# Patient Record
Sex: Male | Born: 1940 | Race: Black or African American | Hispanic: No | Marital: Single | Smoking: Never smoker
Health system: Southern US, Community
[De-identification: ages and names within clinical notes are randomized; demographics above are authoritative.]

## PROBLEM LIST (undated history)

## (undated) DIAGNOSIS — H1045 Other chronic allergic conjunctivitis: Secondary | ICD-10-CM

## (undated) DIAGNOSIS — J189 Pneumonia, unspecified organism: Secondary | ICD-10-CM

## (undated) DIAGNOSIS — K219 Gastro-esophageal reflux disease without esophagitis: Secondary | ICD-10-CM

## (undated) DIAGNOSIS — I209 Angina pectoris, unspecified: Secondary | ICD-10-CM

## (undated) DIAGNOSIS — N289 Disorder of kidney and ureter, unspecified: Secondary | ICD-10-CM

## (undated) DIAGNOSIS — J309 Allergic rhinitis, unspecified: Secondary | ICD-10-CM

## (undated) DIAGNOSIS — E782 Mixed hyperlipidemia: Secondary | ICD-10-CM

## (undated) DIAGNOSIS — I10 Essential (primary) hypertension: Secondary | ICD-10-CM

## (undated) DIAGNOSIS — M722 Plantar fascial fibromatosis: Secondary | ICD-10-CM

## (undated) DIAGNOSIS — N183 Chronic kidney disease, stage 3 unspecified: Secondary | ICD-10-CM

## (undated) DIAGNOSIS — E875 Hyperkalemia: Secondary | ICD-10-CM

## (undated) DIAGNOSIS — B351 Tinea unguium: Secondary | ICD-10-CM

## (undated) DIAGNOSIS — J439 Emphysema, unspecified: Secondary | ICD-10-CM

## (undated) DIAGNOSIS — M1990 Unspecified osteoarthritis, unspecified site: Secondary | ICD-10-CM

## (undated) DIAGNOSIS — E119 Type 2 diabetes mellitus without complications: Secondary | ICD-10-CM

## (undated) DIAGNOSIS — N390 Urinary tract infection, site not specified: Secondary | ICD-10-CM

## (undated) DIAGNOSIS — H814 Vertigo of central origin: Secondary | ICD-10-CM

## (undated) DIAGNOSIS — M7660 Achilles tendinitis, unspecified leg: Secondary | ICD-10-CM

## (undated) DIAGNOSIS — I2510 Atherosclerotic heart disease of native coronary artery without angina pectoris: Secondary | ICD-10-CM

## (undated) DIAGNOSIS — R002 Palpitations: Secondary | ICD-10-CM

## (undated) HISTORY — PX: CARDIAC CATHETERIZATION: SHX172

## (undated) HISTORY — PX: BACK SURGERY: SHX140

---

## 2016-04-19 ENCOUNTER — Observation Stay (HOSPITAL_COMMUNITY)
Admission: EM | Admit: 2016-04-19 | Discharge: 2016-04-20 | Disposition: A | Discharge status: Home / Self Care | Attending: Internal Medicine | Admitting: Internal Medicine

## 2016-04-19 ENCOUNTER — Observation Stay (HOSPITAL_COMMUNITY)

## 2016-04-19 ENCOUNTER — Emergency Department (HOSPITAL_COMMUNITY)

## 2016-04-19 ENCOUNTER — Encounter (HOSPITAL_COMMUNITY): Payer: Self-pay

## 2016-04-19 DIAGNOSIS — N183 Chronic kidney disease, stage 3 unspecified: Secondary | ICD-10-CM

## 2016-04-19 DIAGNOSIS — R799 Abnormal finding of blood chemistry, unspecified: Secondary | ICD-10-CM | POA: Diagnosis present

## 2016-04-19 DIAGNOSIS — I2510 Atherosclerotic heart disease of native coronary artery without angina pectoris: Secondary | ICD-10-CM | POA: Diagnosis not present

## 2016-04-19 DIAGNOSIS — E875 Hyperkalemia: Principal | ICD-10-CM | POA: Diagnosis present

## 2016-04-19 DIAGNOSIS — E1129 Type 2 diabetes mellitus with other diabetic kidney complication: Secondary | ICD-10-CM | POA: Diagnosis present

## 2016-04-19 DIAGNOSIS — N184 Chronic kidney disease, stage 4 (severe): Secondary | ICD-10-CM

## 2016-04-19 DIAGNOSIS — I129 Hypertensive chronic kidney disease with stage 1 through stage 4 chronic kidney disease, or unspecified chronic kidney disease: Secondary | ICD-10-CM | POA: Diagnosis not present

## 2016-04-19 DIAGNOSIS — D649 Anemia, unspecified: Secondary | ICD-10-CM | POA: Diagnosis present

## 2016-04-19 DIAGNOSIS — E1122 Type 2 diabetes mellitus with diabetic chronic kidney disease: Secondary | ICD-10-CM | POA: Insufficient documentation

## 2016-04-19 DIAGNOSIS — N19 Unspecified kidney failure: Secondary | ICD-10-CM

## 2016-04-19 DIAGNOSIS — N179 Acute kidney failure, unspecified: Secondary | ICD-10-CM | POA: Diagnosis present

## 2016-04-19 DIAGNOSIS — N189 Chronic kidney disease, unspecified: Secondary | ICD-10-CM | POA: Diagnosis present

## 2016-04-19 DIAGNOSIS — E1121 Type 2 diabetes mellitus with diabetic nephropathy: Secondary | ICD-10-CM

## 2016-04-19 DIAGNOSIS — N178 Other acute kidney failure: Secondary | ICD-10-CM

## 2016-04-19 HISTORY — DX: Emphysema, unspecified: J43.9

## 2016-04-19 HISTORY — DX: Disorder of kidney and ureter, unspecified: N28.9

## 2016-04-19 HISTORY — DX: Tinea unguium: B35.1

## 2016-04-19 HISTORY — DX: Pneumonia, unspecified organism: J18.9

## 2016-04-19 HISTORY — DX: Gastro-esophageal reflux disease without esophagitis: K21.9

## 2016-04-19 HISTORY — DX: Chronic kidney disease, stage 3 unspecified: N18.30

## 2016-04-19 HISTORY — DX: Other chronic allergic conjunctivitis: H10.45

## 2016-04-19 HISTORY — DX: Allergic rhinitis, unspecified: J30.9

## 2016-04-19 HISTORY — DX: Atherosclerotic heart disease of native coronary artery without angina pectoris: I25.10

## 2016-04-19 HISTORY — DX: Mixed hyperlipidemia: E78.2

## 2016-04-19 HISTORY — DX: Vertigo of central origin: H81.4

## 2016-04-19 HISTORY — DX: Palpitations: R00.2

## 2016-04-19 HISTORY — DX: Type 2 diabetes mellitus without complications: E11.9

## 2016-04-19 HISTORY — DX: Urinary tract infection, site not specified: N39.0

## 2016-04-19 HISTORY — DX: Angina pectoris, unspecified: I20.9

## 2016-04-19 HISTORY — DX: Chronic kidney disease, stage 3 (moderate): N18.3

## 2016-04-19 HISTORY — DX: Unspecified osteoarthritis, unspecified site: M19.90

## 2016-04-19 HISTORY — DX: Achilles tendinitis, unspecified leg: M76.60

## 2016-04-19 HISTORY — DX: Hyperkalemia: E87.5

## 2016-04-19 HISTORY — DX: Essential (primary) hypertension: I10

## 2016-04-19 HISTORY — DX: Plantar fascial fibromatosis: M72.2

## 2016-04-19 LAB — BASIC METABOLIC PANEL
ANION GAP: 9 (ref 5–15)
Anion gap: 6 (ref 5–15)
BUN: 45 mg/dL — ABNORMAL HIGH (ref 6–20)
BUN: 44 mg/dL — AB (ref 6–20)
CALCIUM: 9 mg/dL (ref 8.9–10.3)
CO2: 21 mmol/L — AB (ref 22–32)
CO2: 27 mmol/L (ref 22–32)
CREATININE: 2.82 mg/dL — AB (ref 0.61–1.24)
Calcium: 8.9 mg/dL (ref 8.9–10.3)
Chloride: 102 mmol/L (ref 101–111)
Chloride: 105 mmol/L (ref 101–111)
Creatinine, Ser: 2.93 mg/dL — ABNORMAL HIGH (ref 0.61–1.24)
GFR calc Af Amer: 23 mL/min — ABNORMAL LOW (ref >60)
GFR, EST AFRICAN AMERICAN: 22 mL/min — AB (ref >60)
GFR, EST NON AFRICAN AMERICAN: 19 mL/min — AB (ref >60)
GFR, EST NON AFRICAN AMERICAN: 20 mL/min — AB (ref >60)
GLUCOSE: 115 mg/dL — AB (ref 65–99)
Glucose, Bld: 110 mg/dL — ABNORMAL HIGH (ref 65–99)
Potassium: 5 mmol/L (ref 3.5–5.1)
Potassium: 4.6 mmol/L (ref 3.5–5.1)
SODIUM: 138 mmol/L (ref 135–145)
Sodium: 132 mmol/L — ABNORMAL LOW (ref 135–145)

## 2016-04-19 LAB — GLUCOSE, CAPILLARY
GLUCOSE-CAPILLARY: 71 mg/dL (ref 65–99)
GLUCOSE-CAPILLARY: 237 mg/dL — AB (ref 65–99)
Glucose-Capillary: 57 mg/dL — ABNORMAL LOW (ref 65–99)
Glucose-Capillary: 177 mg/dL — ABNORMAL HIGH (ref 65–99)
Glucose-Capillary: 122 mg/dL — ABNORMAL HIGH (ref 65–99)
Glucose-Capillary: 88 mg/dL (ref 65–99)

## 2016-04-19 LAB — CBC WITH DIFFERENTIAL/PLATELET
BASOS ABS: 0 10*3/uL (ref 0.0–0.1)
BASOS ABS: 0 10*3/uL (ref 0.0–0.1)
BASOS PCT: 1 %
Basophils Relative: 1 %
EOS ABS: 0.3 10*3/uL (ref 0.0–0.7)
EOS PCT: 4 %
Eosinophils Absolute: 0.3 10*3/uL (ref 0.0–0.7)
Eosinophils Relative: 4 %
HCT: 32.6 % — ABNORMAL LOW (ref 39.0–52.0)
HEMATOCRIT: 32.5 % — AB (ref 39.0–52.0)
HEMOGLOBIN: 11.1 g/dL — AB (ref 13.0–17.0)
Hemoglobin: 11 g/dL — ABNORMAL LOW (ref 13.0–17.0)
LYMPHS PCT: 48 %
Lymphocytes Relative: 52 %
Lymphs Abs: 3.3 10*3/uL (ref 0.7–4.0)
Lymphs Abs: 3.1 10*3/uL (ref 0.7–4.0)
MCH: 29.8 pg (ref 26.0–34.0)
MCH: 29.2 pg (ref 26.0–34.0)
MCHC: 34.2 g/dL (ref 30.0–36.0)
MCHC: 33.7 g/dL (ref 30.0–36.0)
MCV: 87.1 fL (ref 78.0–100.0)
MCV: 86.5 fL (ref 78.0–100.0)
MONO ABS: 0.6 10*3/uL (ref 0.1–1.0)
MONOS PCT: 9 %
Monocytes Absolute: 0.6 10*3/uL (ref 0.1–1.0)
Monocytes Relative: 10 %
NEUTROS ABS: 2.2 10*3/uL (ref 1.7–7.7)
Neutro Abs: 2.4 10*3/uL (ref 1.7–7.7)
Neutrophils Relative %: 34 %
Neutrophils Relative %: 37 %
PLATELETS: 298 10*3/uL (ref 150–400)
Platelets: 316 10*3/uL (ref 150–400)
RBC: 3.73 MIL/uL — ABNORMAL LOW (ref 4.22–5.81)
RBC: 3.77 MIL/uL — AB (ref 4.22–5.81)
RDW: 15.5 % (ref 11.5–15.5)
RDW: 14.9 % (ref 11.5–15.5)
WBC: 6.5 10*3/uL (ref 4.0–10.5)
WBC: 6.4 10*3/uL (ref 4.0–10.5)

## 2016-04-19 LAB — URINALYSIS, ROUTINE W REFLEX MICROSCOPIC
Bilirubin Urine: NEGATIVE
Glucose, UA: NEGATIVE mg/dL
Hgb urine dipstick: NEGATIVE
Ketones, ur: NEGATIVE mg/dL
LEUKOCYTES UA: NEGATIVE
NITRITE: NEGATIVE
PH: 6 (ref 5.0–8.0)
Protein, ur: NEGATIVE mg/dL
SPECIFIC GRAVITY, URINE: 1.004 — AB (ref 1.005–1.030)

## 2016-04-19 LAB — HEPATIC FUNCTION PANEL
ALT: 13 U/L — AB (ref 17–63)
AST: 21 U/L (ref 15–41)
Albumin: 3.8 g/dL (ref 3.5–5.0)
Alkaline Phosphatase: 68 U/L (ref 38–126)
TOTAL PROTEIN: 7.1 g/dL (ref 6.5–8.1)
Total Bilirubin: 0.5 mg/dL (ref 0.3–1.2)

## 2016-04-19 LAB — SODIUM, URINE, RANDOM: Sodium, Ur: 32 mmol/L

## 2016-04-19 LAB — CREATININE, URINE, RANDOM: CREATININE, URINE: 37.38 mg/dL

## 2016-04-19 LAB — MRSA PCR SCREENING: MRSA BY PCR: NEGATIVE

## 2016-04-19 LAB — PHOSPHORUS: PHOSPHORUS: 3.9 mg/dL (ref 2.5–4.6)

## 2016-04-19 MED ORDER — HEPARIN SODIUM (PORCINE) 5000 UNIT/ML IJ SOLN
5000.0000 [IU] | Freq: Three times a day (TID) | INTRAMUSCULAR | Status: DC
  Administered 2016-04-19 – 2016-04-20 (×4): 5000 [IU] via SUBCUTANEOUS
  Filled 2016-04-19 (×4): qty 1

## 2016-04-19 MED ORDER — ACETAMINOPHEN 650 MG RE SUPP: 650.0000 mg | Freq: Four times a day (QID) | RECTAL | Status: DC | PRN

## 2016-04-19 MED ORDER — SODIUM CHLORIDE 0.9 % IV BOLUS (SEPSIS)
500.0000 mL | Freq: Once | INTRAVENOUS | Status: AC
  Administered 2016-04-19: 500 mL via INTRAVENOUS

## 2016-04-19 MED ORDER — ONDANSETRON HCL 4 MG/2ML IJ SOLN: 4.0000 mg | Freq: Four times a day (QID) | INTRAMUSCULAR | Status: DC | PRN

## 2016-04-19 MED ORDER — ONDANSETRON HCL 4 MG PO TABS: 4.0000 mg | ORAL_TABLET | Freq: Four times a day (QID) | ORAL | Status: DC | PRN

## 2016-04-19 MED ORDER — ACETAMINOPHEN 325 MG PO TABS: 650.0000 mg | ORAL_TABLET | Freq: Four times a day (QID) | ORAL | Status: DC | PRN

## 2016-04-19 MED ORDER — INSULIN ASPART 100 UNIT/ML ~~LOC~~ SOLN
0.0000 [IU] | Freq: Three times a day (TID) | SUBCUTANEOUS | Status: DC
  Administered 2016-04-19: 2 [IU] via SUBCUTANEOUS
  Administered 2016-04-19: 1 [IU] via SUBCUTANEOUS
  Administered 2016-04-20: 3 [IU] via SUBCUTANEOUS
  Administered 2016-04-20: 2 [IU] via SUBCUTANEOUS

## 2016-04-19 MED ORDER — SODIUM CHLORIDE 0.9 % IV SOLN
INTRAVENOUS | Status: DC
Start: 2016-04-19 — End: 2016-04-19
  Administered 2016-04-19: 08:00:00 via INTRAVENOUS

## 2016-04-19 NOTE — Progress Notes (Signed)
New Admission Note:   Arrival Method: Stretcher from ED Mental Orientation: A&O x4 Telemetry: Box 9, CCMD notified. Assessment: Completed Skin: Assessed with Malen GauzeKim G, No skin issues noted IV: L hand, SL Pain: 0/10 Tubes: N/A Safety Measures: Safety Fall Prevention Plan discussed with patient. Admission: Completed [facility] Orientation: Patient has been orientated to the room, unit and the staff. Family: None at bedside  Orders have been reviewed and implemented. Will continue to monitor the patient. Call light has been placed within reach.  RN unable to complete admission questions due to time. WIll inform oncoming RN to complete.  Rivka BarbaraZenab Rachana Malesky BSN, RN  Phone Number: [phone]

## 2016-04-19 NOTE — Progress Notes (Signed)
Initial Nutrition Assessment  DOCUMENTATION CODES:   Obesity unspecified  INTERVENTION:  Continue rena/carbohydrate modified diet.   RD to continue to monitor for needs.  NUTRITION DIAGNOSIS:   Increased nutrient needs related to acute illness as evidenced by estimated needs.  GOAL:   Patient will meet greater than or equal to 90% of their needs  MONITOR:   PO intake, Labs, Weight trends, Skin, I & O's  REASON FOR ASSESSMENT:   Consult Assessment of nutrition requirement/status  ASSESSMENT:   76 y.o. male with history of diabetes mellitus type 2, hypertension, chronic kidney disease, anemia was [facility] after patient's lab work revealed hyperkalemia. Patient's lab drawn on 04/16/2016 was showing a creatinine of 2.6 with potassium of 5.3. Creatinine on 03/28/2016 was 2.4 potassium 4.9. In the ER today patient's creatinine is around 2.9 with normal potassium. Since patient has no definite follow-up and would like to make sure patient's electrolyte stability patient was admitted for further observation before discharge back to jail.  Meal completion has been 100%. Pt reports having a good appetite currently and PTA with usual consumption of at least 3 meals a day. Pt does report being on a special diet which he believes is a renal diet at his "camp" which he refers to jail. Pt reports no significant changes to his weight. Pt does reports consuming orange juice daily. Pt educated on renal friendly drink options. Pt expressed understanding. Pt with no observed significant fat or muscle mass loss.   Labs and medications reviewed.   Diet Order:  Diet renal/carb modified with fluid restriction Diet-HS Snack? Nothing; Room service appropriate? Yes; Fluid consistency: Thin  Skin:  Reviewed, no issues  Last BM:  1/7  Height:   Ht Readings from Last 1 Encounters:  04/19/16 6\' 1"  (1.854 m)    Weight:   Wt Readings from Last 1 Encounters:  04/19/16 252 lb 12.8 oz  (114.7 kg)    Ideal Body Weight:  83.6 kg  BMI:  Body mass index is 33.35 kg/m.  Estimated Nutritional Needs:   Kcal:  2100-2300  Protein:  100-115 grams  Fluid:  Per MD  EDUCATION NEEDS:   Education needs addressed  Roslyn SmilingStephanie Murice Barbar, MS, RD, LDN Pager # [phone] After hours/ weekend pager # [phone]

## 2016-04-19 NOTE — Progress Notes (Signed)
Hypoglycemic Event  CBG: 57  Treatment: 15 GM carbohydrate snack  Symptoms: None  Follow-up CBG: [id number] CBG Result:71  Possible Reasons for Event: inadequate meal intake  Comments/MD notified:Schorr, NP    Andrew Meyers

## 2016-04-19 NOTE — Progress Notes (Signed)
Progress note  Patient admitted earlier this morning. See H&P. Patient admitted after routine blood work revealed elevated Cr and hyperkalemia. He has no physical complaints and denies any CP SOB N/V/D dysuria. Unclear baseline Cr level.   Hyperkalemia resolved. K 4.6 today. Trend BMP Renal US: consistent with medical renal disease. There is no hydronephrosis. UA unremarkable for infectious process  FeNa 1.75% consistent with intrinsic cause of AKI  [place] [facility] [place] [facility] [place] [facility]  If blood work stable in 24 hours, likely discharge back to jail.   Noralee StainJennifer Axten Pascucci, DO [facility] [email or web address] Password [email or web address] 04/19/2016, 11:04 AM

## 2016-04-19 NOTE — ED Notes (Signed)
Main lab confirms CBC can be added on to blood collected earlier

## 2016-04-19 NOTE — H&P (Signed)
History and Physical    Andrew Meyers DOB: Oct 31, 1940 DOA: 04/19/2016  PCP: No primary care provider on file.  [facility]  Chief Complaint: Worsening renal function. And hyperkalemia.  HPI: Andrew PouchJames XXXFreeman is a 76 y.o. male with history of diabetes mellitus type 2, hypertension, chronic kidney disease, anemia was referred from the jail after patient's lab work revealed hyperkalemia. Patient's lab drawn on 04/16/2016 was showing a creatinine of 2.6 with potassium of 5.3. Creatinine on 03/28/2016 was 2.4 potassium 4.9. In the ER today patient's creatinine is around 2.9 with normal potassium. Since patient has no definite follow-up and would like to make sure patient's electrolyte stability patient was admitted for further observation before discharge back to jail. Patient denies any chest pain nausea vomiting abdominal pain diarrhea. Occasionally get short of breath. Patient also has been having increasing weight over the last 2 years.   ED Course: Patient was given 500 mL of normal saline bolus. UA still pending.  Review of Systems: As per HPI, rest all negative.   Past Medical History:  Diagnosis Date  . Achilles tendonitis   . Allergic rhinitis   . Angina pectoris (HCC)   . Chronic allergic conjunctivitis   . CKD (chronic kidney disease), stage III   . Coronary artery disease   . Diabetes mellitus without complication (HCC)   . Emphysema lung (HCC)   . GERD (gastroesophageal reflux disease)   . Hyperkalemia   . Hypertension   . Mixed hyperlipidemia   . Osteoarthritis   . Palpitations   . Plantar fascial fibromatosis   . Pneumonia   . Renal disorder   . Tinea unguium   . UTI (urinary tract infection)   . Vertigo of central origin     Past Surgical History:  Procedure Laterality Date  . BACK SURGERY    . CARDIAC CATHETERIZATION       reports that he has never smoked. He has never used smokeless tobacco. He reports that he does not drink  alcohol or use drugs.  No Known Allergies  Family History  Problem Relation Age of Onset  . Kidney disease Neg Hx     Prior to Admission medications   Not on File    Physical Exam: Vitals:   04/19/16 0445 04/19/16 0545 04/19/16 0629 04/19/16 0643  BP: 132/62 137/57  (!) 151/63  Pulse: (!) 48 (!) 53  (!) 58  Resp: 18 13  14   Temp:    97.4 F (36.3 C)  TempSrc:    Oral  SpO2: 97% 95%  98%  Height:   6\' 1"  (1.854 m)       Constitutional: Moderately built and nourished. Vitals:   04/19/16 0445 04/19/16 0545 04/19/16 0629 04/19/16 0643  BP: 132/62 137/57  (!) 151/63  Pulse: (!) 48 (!) 53  (!) 58  Resp: 18 13  14   Temp:    97.4 F (36.3 C)  TempSrc:    Oral  SpO2: 97% 95%  98%  Height:   6\' 1"  (1.854 m)    Eyes: Anicteric no pallor. ENMT: No discharge from the ears eyes nose and mouth. Neck: No mass felt. JVD not elevated. Respiratory: No rhonchi or crepitations. Cardiovascular: S1 and S2 heard. Abdomen: Soft nontender bowel sounds present. No guarding or rigidity. Musculoskeletal: No edema. No joint effusion. Skin: No rash. Skin appears warm. Neurologic: Alert awake oriented to time place and person. Moves all extremities. Psychiatric: Appears normal. Normal affect.   Labs on  Admission: I have personally reviewed following labs and imaging studies  CBC:  Recent Labs Lab 04/19/16 0023  WBC 6.5  NEUTROABS 2.2  HGB 11.1*  HCT 32.5*  MCV 87.1  PLT 316   Basic Metabolic Panel:  Recent Labs Lab 04/19/16 0023  NA 132*  K 5.0  CL 102  CO2 21*  GLUCOSE 110*  BUN 45*  CREATININE 2.93*  CALCIUM 9.0   GFR: CrCl cannot be calculated (Unknown ideal weight.). Liver Function Tests: No results for input(s): AST, ALT, ALKPHOS, BILITOT, PROT, ALBUMIN in the last 168 hours. No results for input(s): LIPASE, AMYLASE in the last 168 hours. No results for input(s): AMMONIA in the last 168 hours. Coagulation Profile: No results for input(s): INR, PROTIME in  the last 168 hours. Cardiac Enzymes: No results for input(s): CKTOTAL, CKMB, CKMBINDEX, TROPONINI in the last 168 hours. BNP (last 3 results) No results for input(s): PROBNP in the last 8760 hours. HbA1C: No results for input(s): HGBA1C in the last 72 hours. CBG:  Recent Labs Lab 04/19/16 0633  GLUCAP 57*   Lipid Profile: No results for input(s): CHOL, HDL, LDLCALC, TRIG, CHOLHDL, LDLDIRECT in the last 72 hours. Thyroid Function Tests: No results for input(s): TSH, T4TOTAL, FREET4, T3FREE, THYROIDAB in the last 72 hours. Anemia Panel: No results for input(s): VITAMINB12, FOLATE, FERRITIN, TIBC, IRON, RETICCTPCT in the last 72 hours. Urine analysis: No results found for: COLORURINE, APPEARANCEUR, LABSPEC, PHURINE, GLUCOSEU, HGBUR, BILIRUBINUR, KETONESUR, PROTEINUR, UROBILINOGEN, NITRITE, LEUKOCYTESUR Sepsis Labs: @LABRCNTIP (procalcitonin:4,lacticidven:4) )No results found for this or any previous visit (from the past 240 hour(s)).   Radiological Exams on Admission: Dg Chest Port 1 View  Result Date: 04/19/2016 CLINICAL DATA:  76 y/o M; respiratory failure and shortness of breath. EXAM: PORTABLE CHEST 1 VIEW COMPARISON:  None. FINDINGS: Cardiac silhouette within normal limits given projection and technique. Mild pulmonary vascular congestion. No focal consolidation. No pleural effusion or pneumothorax. Bones are unremarkable. Aortic atherosclerosis with arch calcification. IMPRESSION: Mild pulmonary vascular congestion. No focal consolidation. Aortic atherosclerosis. Electronically Signed   By: Mitzi Hansen M.D.   On: 04/19/2016 05:08     Assessment/Plan Principal Problem:   Renal failure (ARF), acute on chronic (HCC) Active Problems:   Hyperkalemia   DM (diabetes mellitus), type 2 with renal complications (HCC)   Normocytic normochromic anemia    1. 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 Patient did receive normal saline bolus 500 mL in the ER. Will hold off any further fluids. Check FENa and UA which are pending. I have ordered a renal ultrasound and placed patient on renal carb modified diet and consulted nutritionist for dietary recommendations. 2. Diabetes mellitus type 2 - for now I have placed patient on sliding scale coverage. Home medication has to be verified. 3. Hypertension presently off medication since patient's blood pressure is running in the lower side as per the patient. 4. Chronic anemia - check anemia panel. 5. History of CAD - patient states he has had cardiac cath before but no stents. Denies any chest pain at this time.  Patient's home medications has to be verified.  DVT prophylaxis: Heparin. Code Status: Full code.  Family Communication: Discussed with patient.  Disposition Plan: Probably back to jail.  Consults called: None.  Admission status: Observation.    Eduard Clos MD [facility] Pager [phone].  If 7PM-7AM, please contact night-coverage [email or web address] Password [facility]  04/19/2016, 6:56  AM

## 2016-04-19 NOTE — ED Provider Notes (Signed)
[facility]-EMERGENCY DEPT Provider Note   CSN: [id number] Arrival date & time: 04/19/16  0000  By signing my name below, I, Majel Homer, attest that this documentation has been prepared under the direction and in the presence of Shon Baton, MD . Electronically Signed: Majel Homer, Scribe. 04/19/2016. 3:16 AM.  History   Chief Complaint Chief Complaint  Patient presents with  . Abnormal Lab   The history is provided by the patient. No language interpreter was used.   HPI Comments: Andrew Meyers is a 76 y.o. male with PMHx of DM and HTN, brought in by [employer] to the Emergency Department for an evaluation of abnormal labs. Pt reports he was called by his Dr. Cathey Endow, at [facility] this afternoon due to elevated potassium. He states he was advised to visit the ED for possible admission. Pt denies hx of kidney disease, decreased appetite, use of dialysis, chest pain, shortness of breath, leg swelling, dysuria, and urinary frequency. She is currently asymptomatic. It is unclear why labs were drawn. I have lab work at the bedside showing progressive worsening renal function over the last week with incremental increase of potassium. Unknown baseline.  Past Medical History:  Diagnosis Date  . Achilles tendonitis   . Allergic rhinitis   . Angina pectoris (HCC)   . Chronic allergic conjunctivitis   . CKD (chronic kidney disease), stage III   . Coronary artery disease   . Diabetes mellitus without complication (HCC)   . Emphysema lung (HCC)   . GERD (gastroesophageal reflux disease)   . Hyperkalemia   . Hypertension   . Mixed hyperlipidemia   . Osteoarthritis   . Palpitations   . Plantar fascial fibromatosis   . Pneumonia   . Renal disorder   . Tinea unguium   . UTI (urinary tract infection)   . Vertigo of central origin    Patient Active Problem List   Diagnosis Date Noted  . Hyperkalemia 04/19/2016   History reviewed. No pertinent surgical history.  Home  Medications    Prior to Admission medications   Not on File    Family History History reviewed. No pertinent family history.  Social History Social History  Substance Use Topics  . Smoking status: Never Smoker  . Smokeless tobacco: Never Used  . Alcohol use No   Allergies   Patient has no known allergies.   Review of Systems Review of Systems  Constitutional: Negative for appetite change.  Respiratory: Negative for shortness of breath.   Cardiovascular: Negative for chest pain and leg swelling.  Genitourinary: Negative for dysuria and frequency.  All other systems reviewed and are negative.  Physical Exam Updated Vital Signs BP (!) 128/44   Pulse 63   Temp 98.2 F (36.8 C) (Oral)   Resp 16   SpO2 100%   Physical Exam  Constitutional: He is oriented to person, place, and time. No distress.  Elderly, no acute distress  HENT:  Head: Normocephalic and atraumatic.  Cardiovascular: Normal rate, regular rhythm and normal heart sounds.   No murmur heard. Pulmonary/Chest: Effort normal and breath sounds normal. No respiratory distress. He has no wheezes.  Abdominal: Soft. Bowel sounds are normal. There is no tenderness. There is no rebound.  Musculoskeletal: He exhibits edema.  1+ pitting bilateral lower extremity edema  Neurological: He is alert and oriented to person, place, and time.  Skin: Skin is warm and dry.  Psychiatric: He has a normal mood and affect.  Nursing note and vitals reviewed.  ED Treatments / Results  Labs (all labs ordered are listed, but only abnormal results are displayed) Labs Reviewed  BASIC METABOLIC PANEL - Abnormal; Notable for the following:       Result Value   Sodium 132 (*)    CO2 21 (*)    Glucose, Bld 110 (*)    BUN 45 (*)    Creatinine, Ser 2.93 (*)    GFR calc non Af Amer 19 (*)    GFR calc Af Amer 22 (*)    All other components within normal limits  CBC WITH DIFFERENTIAL/PLATELET - Abnormal; Notable for the following:     RBC 3.73 (*)    Hemoglobin 11.1 (*)    HCT 32.5 (*)    All other components within normal limits  URINALYSIS, ROUTINE W REFLEX MICROSCOPIC    EKG  EKG Interpretation  Date/Time:  Monday April 19 2016 03:10:17 EST Ventricular Rate:  52 PR Interval:    QRS Duration: 165 QT Interval:  463 QTC Calculation: 431 R Axis:   -50 Text Interpretation:  Sinus rhythm RBBB and LAFB Left ventricular hypertrophy No prior for comparison Confirmed by Wilkie AyeHORTON  MD, Kaimana Neuzil ([id number]) on 04/19/2016 4:16:57 AM       Radiology Dg Chest Port 1 View  Result Date: 04/19/2016 CLINICAL DATA:  76 y/o M; respiratory failure and shortness of breath. EXAM: PORTABLE CHEST 1 VIEW COMPARISON:  None. FINDINGS: Cardiac silhouette within normal limits given projection and technique. Mild pulmonary vascular congestion. No focal consolidation. No pleural effusion or pneumothorax. Bones are unremarkable. Aortic atherosclerosis with arch calcification. IMPRESSION: Mild pulmonary vascular congestion. No focal consolidation. Aortic atherosclerosis. Electronically Signed   By: Mitzi HansenLance  Furusawa-Stratton M.D.   On: 04/19/2016 05:08    Procedures Procedures (including critical care time)  Medications Ordered in ED Medications  sodium chloride 0.9 % bolus 500 mL (500 mLs Intravenous New Bag/Given 04/19/16 0556)    DIAGNOSTIC STUDIES:  Oxygen Saturation is 99% on RA, normal by my interpretation.    COORDINATION OF CARE:  3:15 AM Discussed treatment plan with pt at bedside and pt agreed to plan.  Initial Impression / Assessment and Plan / ED Course  I have reviewed the triage vital signs and the nursing notes.  Pertinent labs & imaging results that were available during my care of the patient were reviewed by me and considered in my medical decision making (see chart for details).  Clinical Course     Patient chart review and patient history, patient has had progressively worsening renal function with incremental  increases in potassium. Potassium today 5.3. He is completely asymptomatic. Unknown baseline. He is currently in prison and I presume he is unable to get further workup given his referral to the ER. Potassium here is 5.0 without EKG changes. Creatinine is 2.9.  Given his social situation and incarceration, will admit for observation for renal ultrasound. Urinalysis pending. Discussed with Dr. Toniann FailKakrakandy.  I personally performed the services described in this documentation, which was scribed in my presence. The recorded information has been reviewed and is accurate.   Final Clinical Impressions(s) / ED Diagnoses   Final diagnoses:  Hyperkalemia  Stage 3 chronic kidney disease    New Prescriptions New Prescriptions   No medications on file     Shon Batonourtney F Mitra Duling, MD 04/19/16 [phone]

## 2016-04-19 NOTE — ED Triage Notes (Signed)
Pt presents to ED for elevated potassium. Pt states told by Cathey EndowBowen, MD to go to ED for admission. Pt with no specific complaints. Pt denies any chest pain or SOB. A/o x 4, ambulatory at triage.

## 2016-04-19 NOTE — ED Notes (Signed)
ED Provider at bedside. 

## 2016-04-20 LAB — GLUCOSE, CAPILLARY
GLUCOSE-CAPILLARY: 153 mg/dL — AB (ref 65–99)
Glucose-Capillary: 166 mg/dL — ABNORMAL HIGH (ref 65–99)

## 2016-04-20 LAB — BASIC METABOLIC PANEL
Anion gap: 6 (ref 5–15)
Anion gap: 7 (ref 5–15)
BUN: 32 mg/dL — AB (ref 6–20)
BUN: 33 mg/dL — AB (ref 6–20)
CO2: 24 mmol/L (ref 22–32)
CO2: 22 mmol/L (ref 22–32)
CREATININE: 2.43 mg/dL — AB (ref 0.61–1.24)
Calcium: 9.4 mg/dL (ref 8.9–10.3)
Calcium: 9.5 mg/dL (ref 8.9–10.3)
Chloride: 108 mmol/L (ref 101–111)
Chloride: 107 mmol/L (ref 101–111)
Creatinine, Ser: 2.36 mg/dL — ABNORMAL HIGH (ref 0.61–1.24)
GFR calc Af Amer: 28 mL/min — ABNORMAL LOW (ref >60)
GFR calc Af Amer: 29 mL/min — ABNORMAL LOW (ref >60)
GFR, EST NON AFRICAN AMERICAN: 24 mL/min — AB (ref >60)
GFR, EST NON AFRICAN AMERICAN: 25 mL/min — AB (ref >60)
GLUCOSE: 119 mg/dL — AB (ref 65–99)
GLUCOSE: 191 mg/dL — AB (ref 65–99)
Potassium: 5.3 mmol/L — ABNORMAL HIGH (ref 3.5–5.1)
Potassium: 4.7 mmol/L (ref 3.5–5.1)
SODIUM: 138 mmol/L (ref 135–145)
Sodium: 136 mmol/L (ref 135–145)

## 2016-04-20 MED ORDER — SODIUM POLYSTYRENE SULFONATE 15 GM/60ML PO SUSP
15.0000 g | Freq: Once | ORAL | Status: AC
  Administered 2016-04-20: 15 g via ORAL
  Filled 2016-04-20: qty 60

## 2016-04-20 NOTE — Progress Notes (Signed)
Howard Pouch to be D[facility]  per MD order.  Discussed prescriptions and follow up appointments with the patient. Prescriptions given to patient, medication list explained in detail. Pt verbalized understanding.  Allergies as of 04/20/2016   No Known Allergies     Medication List    STOP taking these medications   furosemide 40 MG tablet Commonly known as:  LASIX   lisinopril 10 MG tablet Commonly known as:  PRINIVIL,ZESTRIL     TAKE these medications   acetaminophen 325 MG tablet Commonly known as:  TYLENOL Take 650 mg by mouth every 8 (eight) hours as needed for moderate pain.   albuterol 108 (90 Base) MCG/ACT inhaler Commonly known as:  PROVENTIL HFA;VENTOLIN HFA Inhale 2 puffs into the lungs every 6 (six) hours as needed for wheezing or shortness of breath.   alum & mag hydroxide-simeth 200-200-20 MG/5ML suspension Commonly known as:  MAALOX/MYLANTA Take 10 mLs by mouth 2 (two) times daily.   amitriptyline 100 MG tablet Commonly known as:  ELAVIL Take 100 mg by mouth at bedtime.   amLODipine 10 MG tablet Commonly known as:  NORVASC Take 10 mg by mouth daily.   aspirin EC 81 MG tablet Take 81 mg by mouth daily.   atorvastatin 10 MG tablet Commonly known as:  LIPITOR Take 10 mg by mouth daily.   capsaicin 0.025 % cream Commonly known as:  ZOSTRIX Apply 1 application topically 3 (three) times daily as needed (for external use only).   chlorpheniramine 4 MG tablet Commonly known as:  CHLOR-TRIMETON Take 4 mg by mouth 3 (three) times daily as needed for allergies.   cloNIDine 0.1 MG tablet Commonly known as:  CATAPRES Take 0.1 mg by mouth See admin instructions. Take 0.1 mg by mouth every 8 hours. Hold for systolic BP < 140 and pulse < 60   glucose 4 GM chewable tablet Chew 2-4 tablets by mouth as needed for low blood sugar.   insulin NPH Human 100 UNIT/ML injection Commonly known as:  HUMULIN N,NOVOLIN N Inject 19-30 Units into the skin  See admin instructions. Inject 30 units SQ before breakfast and inject 19 units SQ before dinner.   insulin regular 250 units/2.53mL (100 units/mL) injection Commonly known as:  NOVOLIN R,HUMULIN R Inject 4-10 Units into the skin See admin instructions. Inject 4 units SQ every morning and every evening. Inject 4-10 units SQ 3 times daily before meals per sliding scale: for blood sugar range inject : 250-300 = 4 units, 301-350 = 6 units, 351-400 = 8 units, 401-450 = 10 units and over 451 call MD   isosorbide mononitrate 60 MG 24 hr tablet Commonly known as:  IMDUR Take 60 mg by mouth daily.   mupirocin ointment 2 % Commonly known as:  BACTROBAN Place 1 application into the nose daily.   nitroGLYCERIN 0.4 MG SL tablet Commonly known as:  NITROSTAT Place 0.4 mg under the tongue every 5 (five) minutes as needed for chest pain.   pioglitazone 45 MG tablet Commonly known as:  ACTOS Take 45 mg by mouth daily.   ranolazine 500 MG 12 hr tablet Commonly known as:  RANEXA Take 500 mg by mouth 2 (two) times daily.       Vitals:   04/20/16 0446 04/20/16 0855  BP: (!) 148/58 (!) 154/55  Pulse: (!) 54 (!) 54  Resp: 18 18  Temp: 98.5 F (36.9 C) 98.1 F (36.7 C)    Skin clean, dry and intact without evidence of  skin break down, no evidence of skin tears noted. IV catheter discontinued intact. Site without signs and symptoms of complications. Dressing and pressure applied. Pt denies pain at this time. No complaints noted.  An After Visit Summary was printed and given to the patient. Patient escorted off the unit via Public relations account executivecorrectional officer and refused wheelchair.   Andrew RothmanNatalie Richar Dunklee, RN [facility] Phone [phone]

## 2016-04-20 NOTE — Discharge Instructions (Signed)
Recommendations for Outpatient Follow-up:  1. Follow up with your previous health care provider in 1 week  2. Please obtain BMP in one week

## 2016-04-20 NOTE — Discharge Summary (Addendum)
Physician Discharge Summary  Andrew Meyers WUX:[id number] DOB: 11-01-1940 DOA: 04/19/2016  PCP: No primary care provider on file.  Admit date: 04/19/2016 Discharge date: 04/20/2016  Admitted From: Jail Disposition:  Jail   Recommendations for Outpatient Follow-up:  1. Follow up with your previous health care provider in 1 week. Would recommend outpatient referral for Nephrology  2. Please obtain BMP in one week   Home Health: No  Equipment/Devices: None   Discharge Condition: Stable CODE STATUS: Full  Diet recommendation: Heart healthy   Brief/Interim Summary: Andrew Meyers is a 76 y.o. male with history of diabetes mellitus type 2, hypertension, chronic kidney disease, anemia was referred from the jail after patient's lab work revealed hyperkalemia. Patient's lab drawn on 04/16/2016 was showing a creatinine of 2.6 with potassium of 5.3. Creatinine on 03/28/2016 was 2.4 potassium 4.9. In the ER today patient's creatinine is around 2.9 with normal potassium. Since patient has no definite follow-up and would like to make sure patient's electrolyte stability patient was admitted for further observation before discharge back to jail. UA unremarkable for infectious process. FeNa 1.75% consistent with intrinsic cause of AKI, suspect due to his chronic underlying CKD. Renal US consistent with medical renal disease. There is no hydronephrosis. Patient's hyperkalemia resolved. Kidney level stable.  Subjective on day of discharge: Feeling well today. No complaints. Denies any chest pain or shortness of breath. No nausea, vomiting, diarrhea or abdominal pain.  Discharge Diagnoses:  Principal Problem:   Renal failure (ARF), acute on chronic (HCC) Active Problems:   Hyperkalemia   DM (diabetes mellitus), type 2 with renal complications (HCC)   Normocytic normochromic anemia  1. 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  There is no hydronephrosis. UA unremarkable for infectious process  FeNa 1.75% consistent with intrinsic cause of AKI  Improved with IVF   2. Diabetes mellitus type 2  Continue Actos, insulin regimen   3. Hypertension Will hold lisinopril, lasix on discharge until kidney function stablizes  Continue catapres   4. CAD  Continue ranexa, imdur, lipitor, aspirin, norvasc   Discharge Instructions  Discharge Instructions    Diet - low sodium heart healthy    Complete by:  As directed    Increase activity slowly    Complete by:  As directed      Allergies as of 04/20/2016   No Known Allergies     Medication List    STOP taking these medications   furosemide 40 MG tablet Commonly known as:  LASIX   lisinopril 10 MG tablet Commonly known as:  PRINIVIL,ZESTRIL     TAKE these medications   acetaminophen 325 MG tablet Commonly known as:  TYLENOL Take 650 mg by mouth every 8 (eight) hours as needed for moderate pain.   albuterol 108 (90 Base) MCG/ACT inhaler Commonly known as:  PROVENTIL HFA;VENTOLIN HFA Inhale 2 puffs into the lungs every 6 (six) hours as needed for wheezing or shortness of breath.   alum & mag hydroxide-simeth 200-200-20 MG/5ML suspension Commonly known as:  MAALOX/MYLANTA Take 10 mLs by mouth 2 (two) times daily.   amitriptyline 100 MG tablet Commonly known as:  ELAVIL Take 100 mg by mouth at bedtime.   amLODipine 10 MG tablet Commonly known as:  NORVASC Take 10 mg by mouth daily.   aspirin EC 81 MG tablet Take 81 mg by mouth daily.   atorvastatin 10 MG tablet Commonly known as:  LIPITOR Take 10 mg  by mouth daily.   capsaicin 0.025 % cream Commonly known as:  ZOSTRIX Apply 1 application topically 3 (three) times daily as needed (for external use only).   chlorpheniramine 4 MG tablet Commonly known as:  CHLOR-TRIMETON Take 4 mg by mouth 3 (three) times daily as needed for allergies.   cloNIDine 0.1 MG tablet Commonly known as:   CATAPRES Take 0.1 mg by mouth See admin instructions. Take 0.1 mg by mouth every 8 hours. Hold for systolic BP < 140 and pulse < 60   glucose 4 GM chewable tablet Chew 2-4 tablets by mouth as needed for low blood sugar.   insulin NPH Human 100 UNIT/ML injection Commonly known as:  HUMULIN N,NOVOLIN N Inject 19-30 Units into the skin See admin instructions. Inject 30 units SQ before breakfast and inject 19 units SQ before dinner.   insulin regular 250 units/2.55mL (100 units/mL) injection Commonly known as:  NOVOLIN R,HUMULIN R Inject 4-10 Units into the skin See admin instructions. Inject 4 units SQ every morning and every evening. Inject 4-10 units SQ 3 times daily before meals per sliding scale: for blood sugar range inject : 250-300 = 4 units, 301-350 = 6 units, 351-400 = 8 units, 401-450 = 10 units and over 451 call MD   isosorbide mononitrate 60 MG 24 hr tablet Commonly known as:  IMDUR Take 60 mg by mouth daily.   mupirocin ointment 2 % Commonly known as:  BACTROBAN Place 1 application into the nose daily.   nitroGLYCERIN 0.4 MG SL tablet Commonly known as:  NITROSTAT Place 0.4 mg under the tongue every 5 (five) minutes as needed for chest pain.   pioglitazone 45 MG tablet Commonly known as:  ACTOS Take 45 mg by mouth daily.   ranolazine 500 MG 12 hr tablet Commonly known as:  RANEXA Take 500 mg by mouth 2 (two) times daily.       No Known Allergies  Consultations:  None   Procedures/Studies: US Renal  Result Date: 04/19/2016 CLINICAL DATA:  Acute renal failure ; history of chronic renal insufficiency stage III, diabetes, hypertension. EXAM: RENAL / URINARY TRACT ULTRASOUND COMPLETE COMPARISON:  None in PACs FINDINGS: Right Kidney: Length: 10.8 cm. The renal cortical echotexture is approximately equal to that of the adjacent liver. There is an upper pole cortical cyst measuring 1.4 cm in greatest dimension. There is no hydronephrosis. Left Kidney: Length: 10.4 cm.  The renal cortical echotexture is similar to that on the right. There is a lower pole cyst measuring 2.8 x 2.3 x 2.8 cm. There is no hydronephrosis. Bladder: The partially distended urinary bladder is normal. IMPRESSION: Mildly increased renal cortical echotexture bilaterally consistent with medical renal disease. There is no hydronephrosis. Electronically Signed   By: David  [place] M.D.   On: 04/19/2016 08:14   Dg Chest Port 1 View  Result Date: 04/19/2016 CLINICAL DATA:  76 y/o M; respiratory failure and shortness of breath. EXAM: PORTABLE CHEST 1 VIEW COMPARISON:  None. FINDINGS: Cardiac silhouette within normal limits given projection and technique. Mild pulmonary vascular congestion. No focal consolidation. No pleural effusion or pneumothorax. Bones are unremarkable. Aortic atherosclerosis with arch calcification. IMPRESSION: Mild pulmonary vascular congestion. No focal consolidation. Aortic atherosclerosis. Electronically Signed   By: Mitzi Hansen M.D.   On: 04/19/2016 05:08       Discharge Exam: Vitals:   04/20/16 0446 04/20/16 0855  BP: (!) 148/58 (!) 154/55  Pulse: (!) 54 (!) 54  Resp: 18 18  Temp: 98.5 F (  36.9 C) 98.1 F (36.7 C)   Vitals:   04/19/16 1835 04/19/16 2031 04/20/16 0446 04/20/16 0855  BP: (!) 172/66 (!) 159/62 (!) 148/58 (!) 154/55  Pulse: 64 63 (!) 54 (!) 54  Resp: 14 18 18 18   Temp: 98 F (36.7 C) 97.2 F (36.2 C) 98.5 F (36.9 C) 98.1 F (36.7 C)  TempSrc: Oral Oral Oral Oral  SpO2: 99% 98%  98%  Weight:      Height:        General: Pt is alert, awake, not in acute distress Cardiovascular: RRR, S1/S2 +, no rubs, no gallops Respiratory: CTA bilaterally, no wheezing, no rhonchi Abdominal: Soft, NT, ND, bowel sounds + Extremities: no edema, no cyanosis    The results of significant diagnostics from this hospitalization (including imaging, microbiology, ancillary and laboratory) are listed below for reference.     Microbiology: Recent  Results (from the past 240 hour(s))  MRSA PCR Screening     Status: None   Collection Time: 04/19/16  6:51 AM  Result Value Ref Range Status   MRSA by PCR NEGATIVE NEGATIVE Final    Comment:        The GeneXpert MRSA Assay (FDA approved for NASAL specimens only), is one component of a comprehensive MRSA colonization surveillance program. It is not intended to diagnose MRSA infection nor to guide or monitor treatment for MRSA infections.      Labs: BNP (last 3 results) No results for input(s): BNP in the last 8760 hours. Basic Metabolic Panel:  Recent Labs Lab 04/19/16 0023 04/19/16 0711 04/20/16 0547 04/20/16 1110  NA 132* 138 138 136  K 5.0 4.6 5.3* 4.7  CL 102 105 108 107  CO2 21* 27 24 22   GLUCOSE 110* 115* 119* 191*  BUN 45* 44* 32* 33*  CREATININE 2.93* 2.82* 2.43* 2.36*  CALCIUM 9.0 8.9 9.4 9.5  PHOS  --  3.9  --   --    Liver Function Tests:  Recent Labs Lab 04/19/16 0711  AST 21  ALT 13*  ALKPHOS 68  BILITOT 0.5  PROT 7.1  ALBUMIN 3.8   No results for input(s): LIPASE, AMYLASE in the last 168 hours. No results for input(s): AMMONIA in the last 168 hours. CBC:  Recent Labs Lab 04/19/16 0023 04/19/16 0711  WBC 6.5 6.4  NEUTROABS 2.2 2.4  HGB 11.1* 11.0*  HCT 32.5* 32.6*  MCV 87.1 86.5  PLT 316 298   Cardiac Enzymes: No results for input(s): CKTOTAL, CKMB, CKMBINDEX, TROPONINI in the last 168 hours. BNP: Invalid input(s): POCBNP CBG:  Recent Labs Lab 04/19/16 0805 04/19/16 1238 04/19/16 1743 04/19/16 2125 04/20/16 0827  GLUCAP 177* 122* 88 237* 153*   D-Dimer No results for input(s): DDIMER in the last 72 hours. Hgb A1c No results for input(s): HGBA1C in the last 72 hours. Lipid Profile No results for input(s): CHOL, HDL, LDLCALC, TRIG, CHOLHDL, LDLDIRECT in the last 72 hours. Thyroid function studies No results for input(s): TSH, T4TOTAL, T3FREE, THYROIDAB in the last 72 hours.  Invalid input(s): FREET3 Anemia work  up No results for input(s): VITAMINB12, FOLATE, FERRITIN, TIBC, IRON, RETICCTPCT in the last 72 hours. Urinalysis    Component Value Date/Time   COLORURINE STRAW (A) 04/19/2016 0703   APPEARANCEUR CLEAR 04/19/2016 0703   LABSPEC 1.004 (L) 04/19/2016 0703   PHURINE 6.0 04/19/2016 0703   GLUCOSEU NEGATIVE 04/19/2016 0703   HGBUR NEGATIVE 04/19/2016 0703   BILIRUBINUR NEGATIVE 04/19/2016 0703   KETONESUR NEGATIVE 04/19/2016 0703  PROTEINUR NEGATIVE 04/19/2016 0703   NITRITE NEGATIVE 04/19/2016 0703   LEUKOCYTESUR NEGATIVE 04/19/2016 0703   Sepsis Labs Invalid input(s): PROCALCITONIN,  WBC,  LACTICIDVEN Microbiology Recent Results (from the past 240 hour(s))  MRSA PCR Screening     Status: None   Collection Time: 04/19/16  6:51 AM  Result Value Ref Range Status   MRSA by PCR NEGATIVE NEGATIVE Final    Comment:        The GeneXpert MRSA Assay (FDA approved for NASAL specimens only), is one component of a comprehensive MRSA colonization surveillance program. It is not intended to diagnose MRSA infection nor to guide or monitor treatment for MRSA infections.      Time coordinating discharge: Over 30 minutes  SIGNED:  Noralee Stain, DO [facility] Pager [phone]  If 7PM-7AM, please contact night-coverage [email or web address] Password [facility] 04/20/2016, 12:34 PM

## 2017-01-13 IMAGING — US US RENAL
1 series · 14 of 25 positions shown · non-contrast
Comparison: None in PACs

CLINICAL DATA: Acute renal failure ; history of chronic renal
insufficiency stage III, diabetes, hypertension.

EXAM:
RENAL / URINARY TRACT ULTRASOUND COMPLETE

[Series 1: us renal · 0.28mm/px · 14 of 34 slices shown]
[im 1/34]
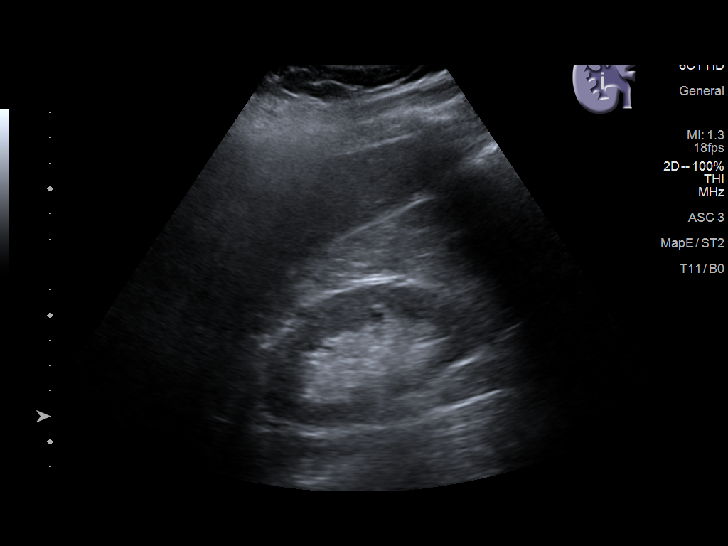
[im 3/34]
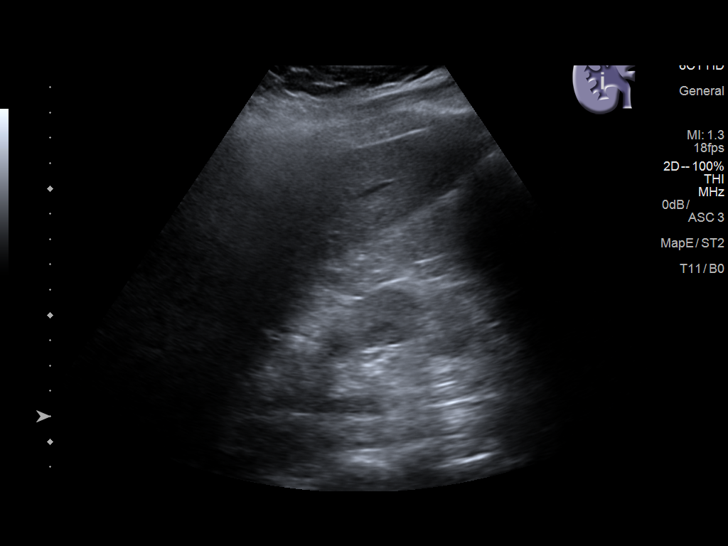
[im 6/34]
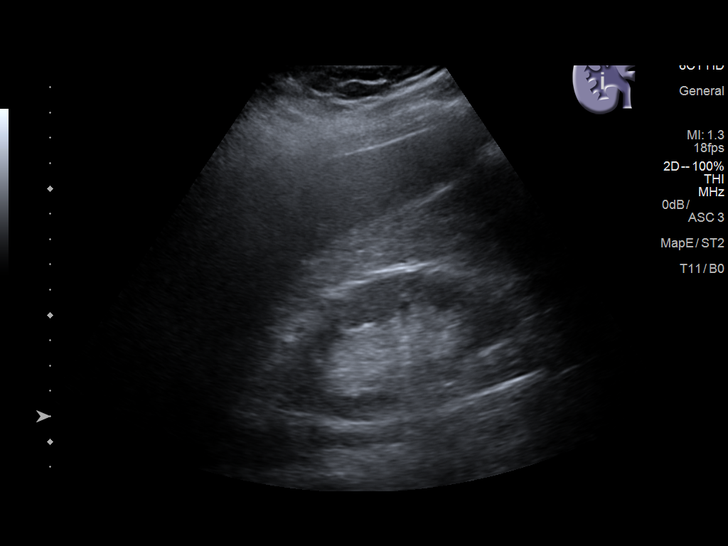
[im 9/34]
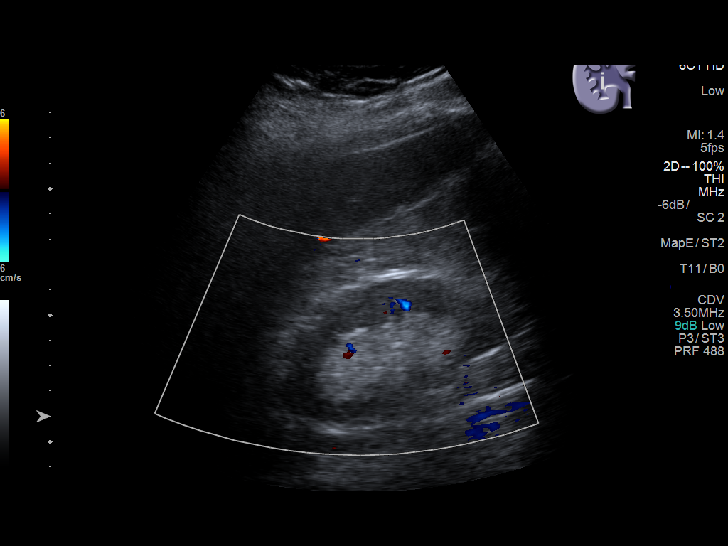
[im 12/34]
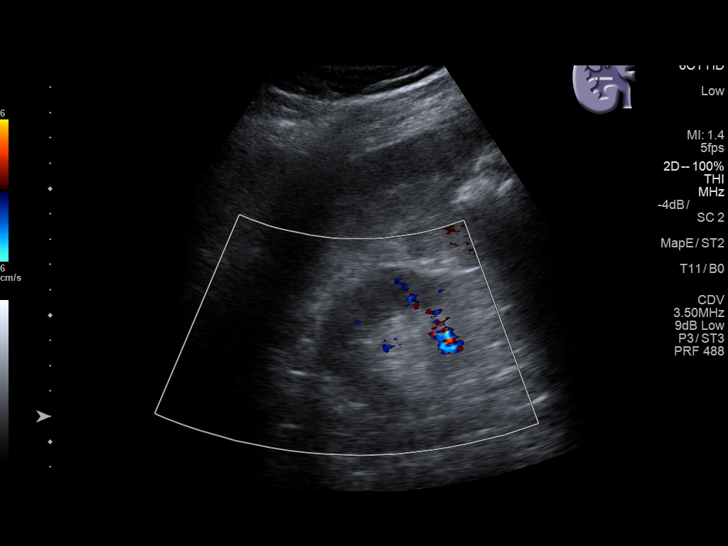
[im 13/34]
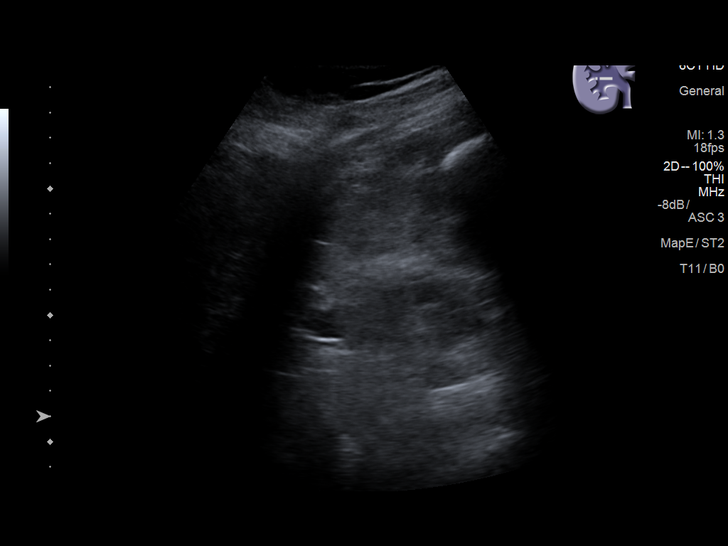
[im 16/34]
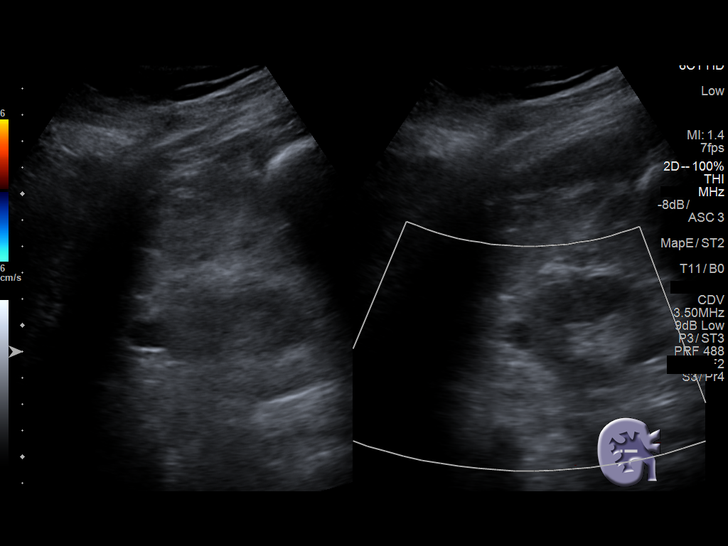
[im 18/34]
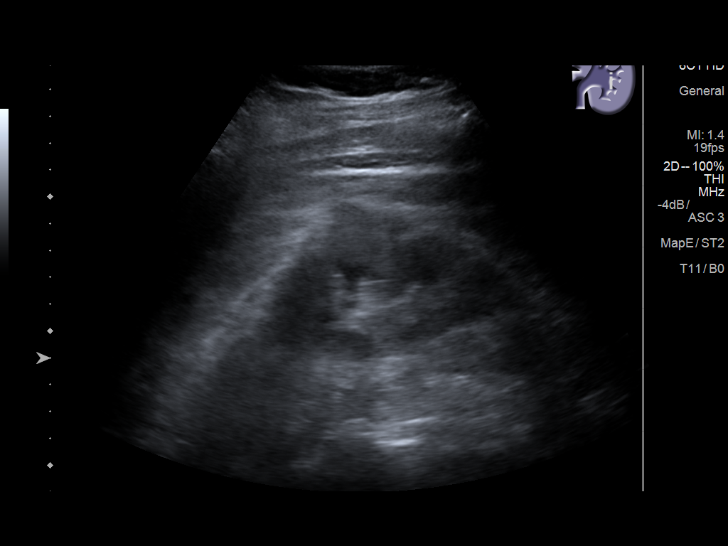
[im 21/34]
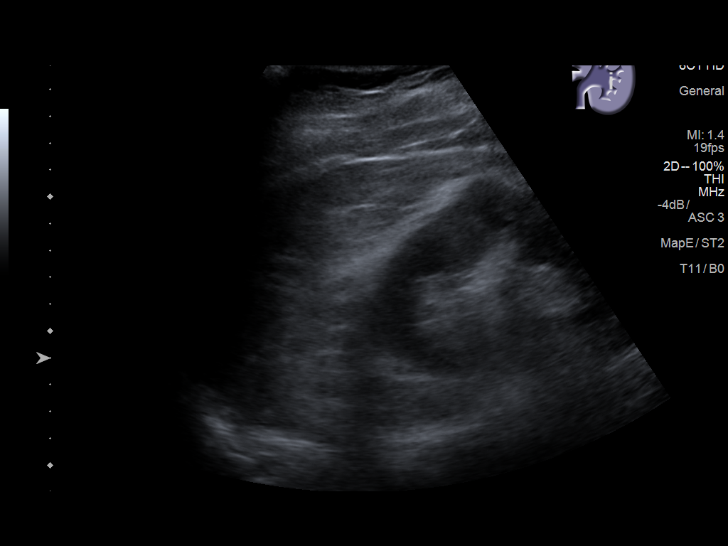
[im 23/34]
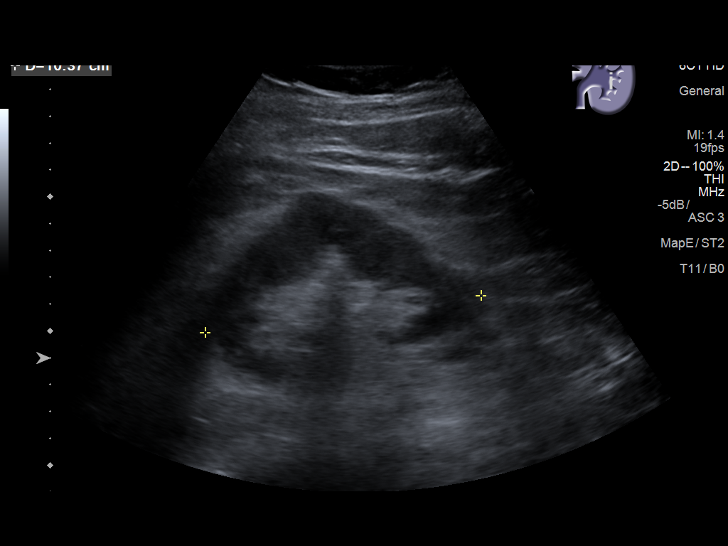
[im 25/34]
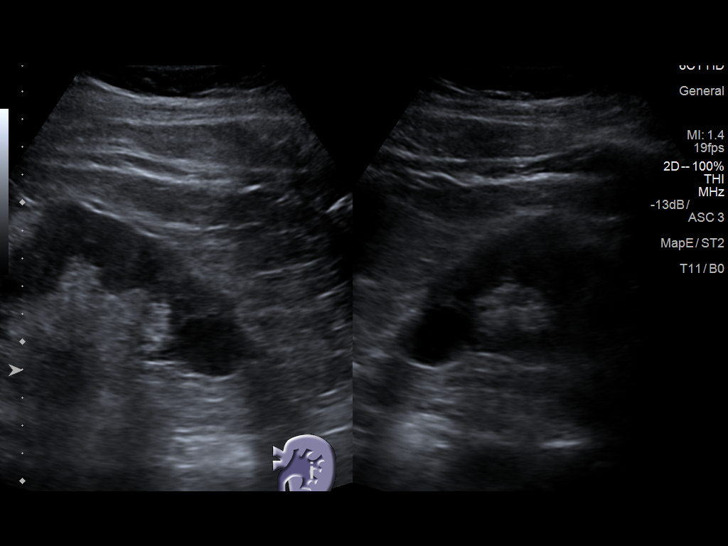
[im 28/34]
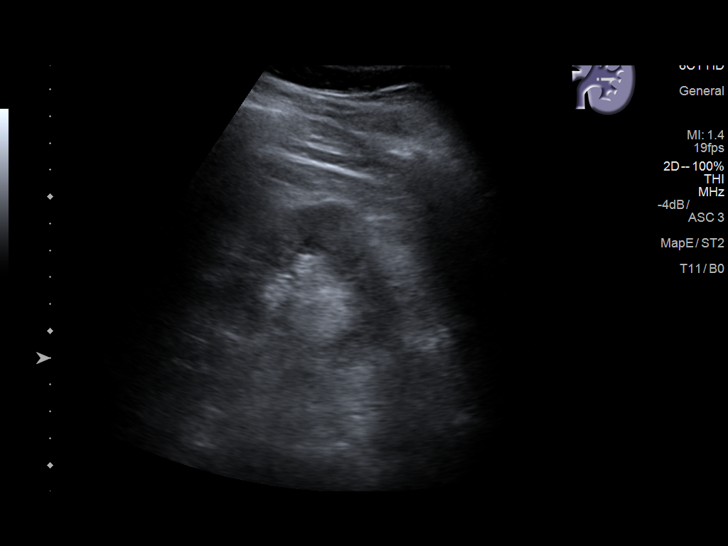
[im 31/34]
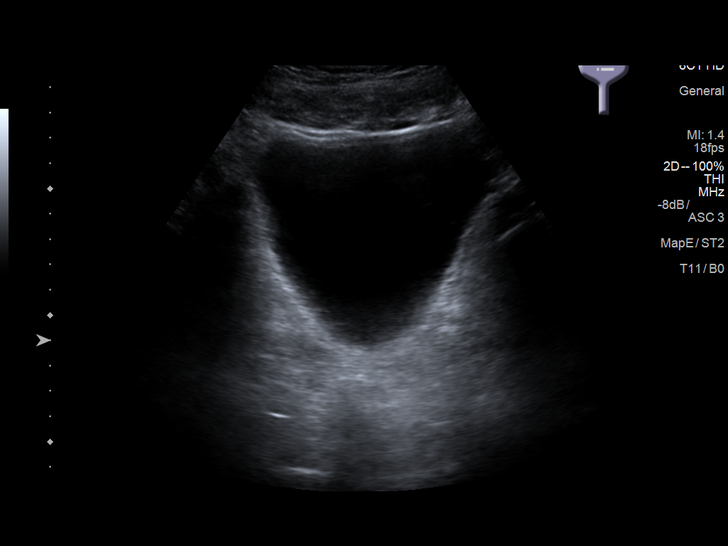
[im 34/34]
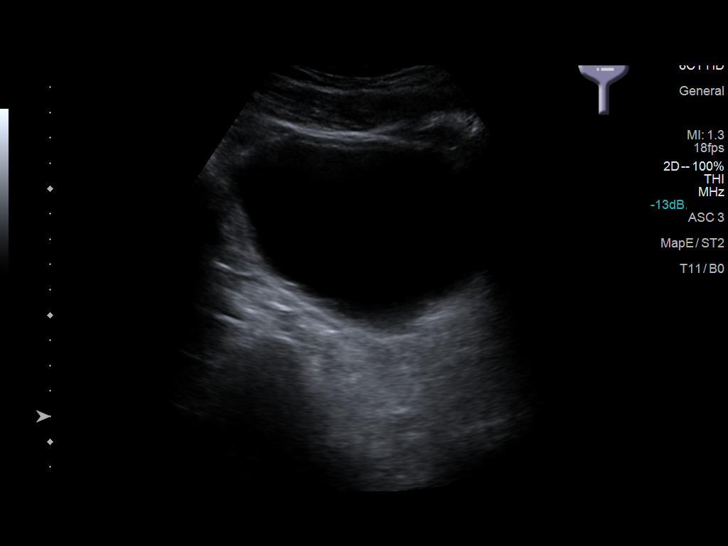

[14 of 25 positions shown; findings below may reference images not displayed]

FINDINGS: Right Kidney:

Length: 10.8 cm. The renal cortical echotexture is approximately
equal to that of the adjacent liver. There is an upper pole cortical
cyst measuring 1.4 cm in greatest dimension. There is no
hydronephrosis.

Left Kidney:

Length: 10.4 cm. The renal cortical echotexture is similar to that
on the right. There is a lower pole cyst measuring 2.8 x 2.3 x
cm. There is no hydronephrosis.

Bladder:

The partially distended urinary bladder is normal.
IMPRESSION: Mildly increased renal cortical echotexture bilaterally consistent
with medical renal disease. There is no hydronephrosis.

## 2018-08-22 IMAGING — CR DG CHEST 1V PORT
1 series · 1 of 1 positions shown · non-contrast
Comparison: None.

CLINICAL DATA: 75 y/o M; respiratory failure and shortness of
breath.

EXAM:
PORTABLE CHEST 1 VIEW

[AP]
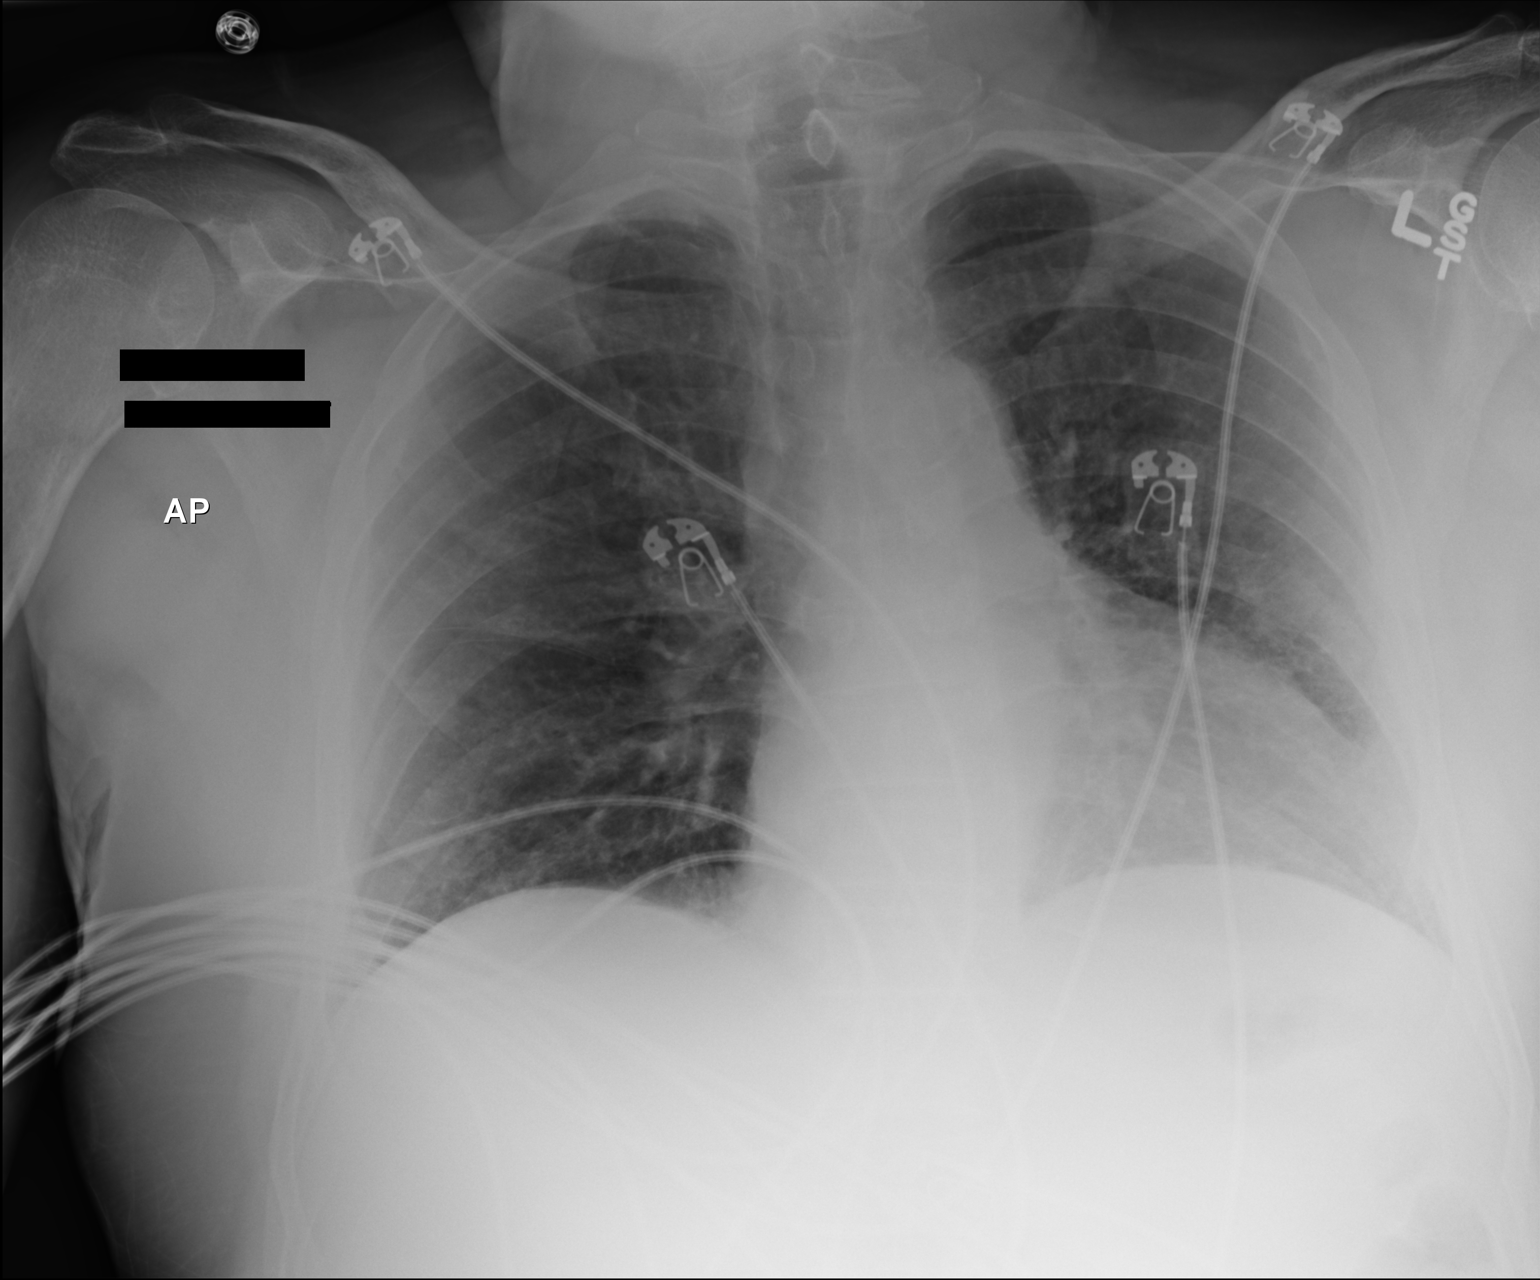

[1 of 1 positions shown; findings below may reference images not displayed]

FINDINGS: Cardiac silhouette within normal limits given projection and
technique. Mild pulmonary vascular congestion. No focal
consolidation. No pleural effusion or pneumothorax. Bones are
unremarkable. Aortic atherosclerosis with arch calcification.
IMPRESSION: Mild pulmonary vascular congestion. No focal consolidation. Aortic
atherosclerosis.

By: Numan Al Chund M.D.

## 2019-01-11 DEATH — deceased
